# Patient Record
Sex: Male | Born: 1955 | Race: White | Hispanic: No | Marital: Married | Smoking: Current every day smoker
Health system: Southern US, Community
[De-identification: ages and names within clinical notes are randomized; demographics above are authoritative.]

## PROBLEM LIST (undated history)

## (undated) DIAGNOSIS — E785 Hyperlipidemia, unspecified: Secondary | ICD-10-CM

## (undated) DIAGNOSIS — M109 Gout, unspecified: Secondary | ICD-10-CM

## (undated) HISTORY — DX: Hyperlipidemia, unspecified: E78.5

---

## 2009-12-27 ENCOUNTER — Emergency Department: Payer: Self-pay | Admitting: Internal Medicine

## 2010-01-02 ENCOUNTER — Emergency Department: Payer: Self-pay | Admitting: Emergency Medicine

## 2010-01-17 ENCOUNTER — Emergency Department: Payer: Self-pay | Admitting: Emergency Medicine

## 2014-11-30 ENCOUNTER — Ambulatory Visit: Payer: Self-pay

## 2020-09-12 ENCOUNTER — Telehealth: Payer: Self-pay

## 2020-09-12 DIAGNOSIS — Z122 Encounter for screening for malignant neoplasm of respiratory organs: Secondary | ICD-10-CM

## 2020-09-12 DIAGNOSIS — Z87891 Personal history of nicotine dependence: Secondary | ICD-10-CM

## 2020-09-12 NOTE — Telephone Encounter (Signed)
Contacted patient for lung CT screening program based on referral form Lee Carter.  Patient is agreeable and CT scheduled for Dec 28 at 2pm.  He confirmed he has [employer].  He is a current smoker who started smoking at age 64.  He smokes less than a pack a day.  He will be texted address to imaging center, phone number to lung navigator and appt day and time.

## 2020-09-13 NOTE — Telephone Encounter (Signed)
Current smoker, 30 pack year history

## 2020-09-13 NOTE — Addendum Note (Signed)
Addended by: Jonne Ply on: 09/13/2020 09:49 AM   Modules accepted: Orders

## 2020-09-27 ENCOUNTER — Other Ambulatory Visit: Payer: Self-pay

## 2020-09-27 DIAGNOSIS — Z20822 Contact with and (suspected) exposure to covid-19: Secondary | ICD-10-CM

## 2020-09-29 ENCOUNTER — Ambulatory Visit: Payer: Self-pay

## 2020-09-29 LAB — SPECIMEN STATUS REPORT

## 2020-09-29 LAB — SARS-COV-2, NAA 2 DAY TAT

## 2020-09-29 LAB — NOVEL CORONAVIRUS, NAA: SARS-CoV-2, NAA: NOT DETECTED

## 2020-10-17 ENCOUNTER — Encounter: Payer: Self-pay | Admitting: Nurse Practitioner

## 2020-10-18 ENCOUNTER — Inpatient Hospital Stay: Payer: BC Managed Care – PPO | Attending: Nurse Practitioner | Admitting: Nurse Practitioner

## 2020-10-18 ENCOUNTER — Ambulatory Visit
Admission: RE | Admit: 2020-10-18 | Discharge: 2020-10-18 | Disposition: A | Discharge status: Home / Self Care | Payer: BC Managed Care – PPO | Source: Ambulatory Visit | Attending: Nurse Practitioner | Admitting: Nurse Practitioner

## 2020-10-18 ENCOUNTER — Emergency Department: Payer: BC Managed Care – PPO

## 2020-10-18 ENCOUNTER — Emergency Department
Admission: EM | Admit: 2020-10-18 | Discharge: 2020-10-18 | Disposition: A | Discharge status: Home / Self Care | Payer: BC Managed Care – PPO | Attending: Emergency Medicine | Admitting: Emergency Medicine

## 2020-10-18 ENCOUNTER — Other Ambulatory Visit: Payer: Self-pay

## 2020-10-18 DIAGNOSIS — F17210 Nicotine dependence, cigarettes, uncomplicated: Secondary | ICD-10-CM | POA: Insufficient documentation

## 2020-10-18 DIAGNOSIS — Z87891 Personal history of nicotine dependence: Secondary | ICD-10-CM | POA: Insufficient documentation

## 2020-10-18 DIAGNOSIS — Z122 Encounter for screening for malignant neoplasm of respiratory organs: Secondary | ICD-10-CM

## 2020-10-18 DIAGNOSIS — M109 Gout, unspecified: Secondary | ICD-10-CM | POA: Insufficient documentation

## 2020-10-18 DIAGNOSIS — M79671 Pain in right foot: Secondary | ICD-10-CM | POA: Diagnosis present

## 2020-10-18 LAB — BASIC METABOLIC PANEL
Anion gap: 10 (ref 5–15)
BUN: 14 mg/dL (ref 8–23)
CO2: 26 mmol/L (ref 22–32)
Calcium: 9.4 mg/dL (ref 8.9–10.3)
Chloride: 100 mmol/L (ref 98–111)
Creatinine, Ser: 1.21 mg/dL (ref 0.61–1.24)
GFR, Estimated: >60 mL/min (ref >60)
Glucose, Bld: 105 mg/dL — ABNORMAL HIGH (ref 70–99)
Potassium: 3.9 mmol/L (ref 3.5–5.1)
Sodium: 136 mmol/L (ref 135–145)

## 2020-10-18 LAB — CBC
HCT: 45.2 % (ref 39.0–52.0)
Hemoglobin: 15.6 g/dL (ref 13.0–17.0)
MCH: 31.7 pg (ref 26.0–34.0)
MCHC: 34.5 g/dL (ref 30.0–36.0)
MCV: 91.9 fL (ref 80.0–100.0)
Platelets: 232 10*3/uL (ref 150–400)
RBC: 4.92 MIL/uL (ref 4.22–5.81)
RDW: 12.7 % (ref 11.5–15.5)
WBC: 11.7 10*3/uL — ABNORMAL HIGH (ref 4.0–10.5)
nRBC: 0 % (ref 0.0–0.2)

## 2020-10-18 LAB — LACTIC ACID, PLASMA: Lactic Acid, Venous: 1.1 mmol/L (ref 0.5–1.9)

## 2020-10-18 LAB — URIC ACID: Uric Acid, Serum: 10.4 mg/dL — ABNORMAL HIGH (ref 3.7–8.6)

## 2020-10-18 MED ORDER — OXYCODONE-ACETAMINOPHEN 5-325 MG PO TABS: 1.0000 | ORAL_TABLET | ORAL | 0 refills | Status: AC | PRN

## 2020-10-18 MED ORDER — OXYCODONE-ACETAMINOPHEN 5-325 MG PO TABS
1.0000 | ORAL_TABLET | Freq: Once | ORAL | Status: AC
  Administered 2020-10-18: 19:00:00 1 via ORAL
  Filled 2020-10-18: qty 1

## 2020-10-18 NOTE — ED Triage Notes (Signed)
Pt comes with right foot swelling starting Saturday. Has had gout in toe before. No injury, bite, etc.

## 2020-10-18 NOTE — ED Provider Notes (Signed)
[facility] Emergency Department Provider Note   ____________________________________________   Event Date/Time   First MD Initiated Contact with Patient 10/18/20 1815     (approximate)  I have reviewed the triage vital signs and the nursing notes.   HISTORY  Chief Complaint Foot Pain    HPI Lee Carter is a 64 y.o. male with possible history of gout who presents to the ED complaining of foot pain.  Patient reports that for the past 3 days he has had increasing pain and swelling in his right foot.  He denies any recent injuries or trauma to the foot or ankle.  He has redness and swelling extending over the dorsum of his right foot and has significant pain with any movement at his right ankle.  The pain has gotten to the point that he cannot bear any weight on the foot.  He denies any fevers, nausea, vomiting, chest pain, or shortness of breath.  Symptoms are similar to when he had gout in the past, but previously it affected his right big toe.  He saw his PCP for this problem and was started on antibiotics a couple of days ago but has not had any relief.  He did take 1.2 mg of colchicine earlier today.        History reviewed. No pertinent past medical history.  There are no problems to display for this patient.   History reviewed. No pertinent surgical history.  Prior to Admission medications   Medication Sig Start Date End Date Taking? Authorizing Provider  oxyCODONE-acetaminophen (PERCOCET) 5-325 MG tablet Take 1 tablet by mouth every 4 (four) hours as needed. 10/18/20 10/18/21 Yes Chesley Noon, MD    Allergies Patient has no known allergies.  History reviewed. No pertinent family history.  Social History Social History   Tobacco Use  . Smoking status: Current Every Day Smoker    Packs/day: 0.75    Years: 40.00    Pack years: 30.00    Types: Cigarettes    Review of Systems  Constitutional: No fever/chills Eyes: No visual  changes. ENT: No sore throat. Cardiovascular: Denies chest pain. Respiratory: Denies shortness of breath. Gastrointestinal: No abdominal pain.  No nausea, no vomiting.  No diarrhea.  No constipation. Genitourinary: Negative for dysuria. Musculoskeletal: Negative for back pain.  Positive for right foot pain. Skin: Negative for rash. Neurological: Negative for headaches, focal weakness or numbness.  ____________________________________________   PHYSICAL EXAM:  VITAL SIGNS: ED Triage Vitals [10/18/20 1454]  Enc Vitals Group     BP (!) 149/92     Pulse Rate 89     Resp 18     Temp 99.2 F (37.3 C)     Temp Source Oral     SpO2 96 %     Weight 229 lb 4.5 oz (104 kg)     Height 5\' 6"  (1.676 m)     Head Circumference      Peak Flow      Pain Score 10     Pain Loc      Pain Edu?      Excl. in GC?     Constitutional: Alert and oriented. Eyes: Conjunctivae are normal. Head: Atraumatic. Nose: No congestion/rhinnorhea. Mouth/Throat: Mucous membranes are moist. Neck: Normal ROM Cardiovascular: Normal rate, regular rhythm. Grossly normal heart sounds. Respiratory: Normal respiratory effort.  No retractions. Lungs CTAB. Gastrointestinal: Soft and nontender. No distention. Genitourinary: deferred Musculoskeletal: Diffuse edema and mild erythema over the dorsum of right foot with  associated tenderness to palpation.  Mild discomfort noted with range of motion of right big toe, more significant discomfort with ranging of right ankle. Neurologic:  Normal speech and language. No gross focal neurologic deficits are appreciated. Skin:  Skin is warm, dry and intact. No rash noted. Psychiatric: Mood and affect are normal. Speech and behavior are normal.  ____________________________________________   LABS (all labs ordered are listed, but only abnormal results are displayed)  Labs Reviewed  BASIC METABOLIC PANEL - Abnormal; Notable for the following components:      Result Value    Glucose, Bld 105 (*)    All other components within normal limits  CBC - Abnormal; Notable for the following components:   WBC 11.7 (*)    All other components within normal limits  URIC ACID - Abnormal; Notable for the following components:   Uric Acid, Serum 10.4 (*)    All other components within normal limits  LACTIC ACID, PLASMA  LACTIC ACID, PLASMA    PROCEDURES  Procedure(s) performed (including Critical Care):  Procedures   ____________________________________________   INITIAL IMPRESSION / ASSESSMENT AND PLAN / ED COURSE       64 year old male with past medical history of gout presents to the ED with 3 days of pain and swelling in his right foot, most severe with movement of his right ankle.  He does have erythema and tenderness diffusely to the foot but pain is greatest with any range of motion of his right ankle.  He has no fevers and symptoms seem most consistent with gouty arthritis rather than a septic arthritis.  Uric acid is elevated and there is only a very mild leukocytosis.  He is currently being treated with antibiotics by his PCP, which I would not change.  He took loading dose of colchicine earlier today and was counseled to take 2 more doses of 0.6 mg for further treatment.  We will also start patient on short course of pain medication.  He was counseled to follow-up with his PCP and also provided with referral to rheumatology.  He was counseled to return to the ED for new worsening symptoms, patient agrees with plan.      ____________________________________________   FINAL CLINICAL IMPRESSION(S) / ED DIAGNOSES  Final diagnoses:  Acute gout of right ankle, unspecified cause     ED Discharge Orders         Ordered    oxyCODONE-acetaminophen (PERCOCET) 5-325 MG tablet  Every 4 hours PRN        10/18/20 1917           Note:  This document was prepared using Dragon voice recognition software and may include unintentional dictation errors.    Chesley Noon, MD 10/18/20 1921

## 2020-10-18 NOTE — Progress Notes (Signed)
Virtual Visit via Video Enabled Telemedicine Note   I connected with Sharee Holster on 10/18/20 at 1:45 PM EST by video enabled telemedicine visit and verified that I am speaking with the correct person using two identifiers.   I discussed the limitations, risks, security and privacy concerns of performing an evaluation and management service by telemedicine and the availability of in-person appointments. I also discussed with the patient that there may be a patient responsible charge related to this service. The patient expressed understanding and agreed to proceed.   Other persons participating in the visit and their role in the encounter: Burgess Estelle, RN- checking in patient & navigation  Patient's location: [place]  Provider's location: Clinic  Chief Complaint: Low Dose CT Screening  Patient agreed to evaluation by telemedicine to discuss shared decision making for consideration of low dose CT lung cancer screening.    In accordance with CMS guidelines, patient has met eligibility criteria including age, absence of signs or symptoms of lung cancer.  Social History   Tobacco Use  . Smoking status: Current Every Day Smoker    Packs/day: 0.75    Years: 40.00    Pack years: 30.00    Types: Cigarettes  . Smokeless tobacco: Not on file  Substance Use Topics  . Alcohol use: Not on file     A shared decision-making session was conducted prior to the performance of CT scan. This includes one or more decision aids, includes benefits and harms of screening, follow-up diagnostic testing, over-diagnosis, false positive rate, and total radiation exposure.   Counseling on the importance of adherence to annual lung cancer LDCT screening, impact of co-morbidities, and ability or willingness to undergo diagnosis and treatment is imperative for compliance of the program.   Counseling on the importance of continued smoking cessation for former smokers; the importance of smoking cessation  for current smokers, and information about tobacco cessation interventions have been given to patient including [place] and [facility] [employer] [place] programs.   Written order for lung cancer screening with LDCT has been given to the patient and any and all questions have been answered to the best of my abilities.    Yearly follow up will be coordinated by Burgess Estelle, Thoracic Navigator.  I discussed the assessment and treatment plan with the patient. The patient was provided an opportunity to ask questions and all were answered. The patient agreed with the plan and demonstrated an understanding of the instructions.   The patient was advised to call back or seek an in-person evaluation if the symptoms worsen or if the condition fails to improve as anticipated.   I provided 15 minutes of face-to-face video visit time during this encounter, and > 50% was spent counseling as documented under my assessment & plan.   Beckey Rutter, DNP, AGNP-C [place] at [facility] [phone] (clinic)

## 2020-10-20 ENCOUNTER — Encounter: Payer: Self-pay | Admitting: *Deleted

## 2021-06-30 IMAGING — CR DG FOOT 2V*R*
2 series · 2 of 2 positions shown · non-contrast
Comparison: None.

CLINICAL DATA: Right foot swelling for 3 days. Infection versus
gout. Patient reports history of gout. No known injury.

EXAM:
RIGHT FOOT - 2 VIEW

[foot ap]
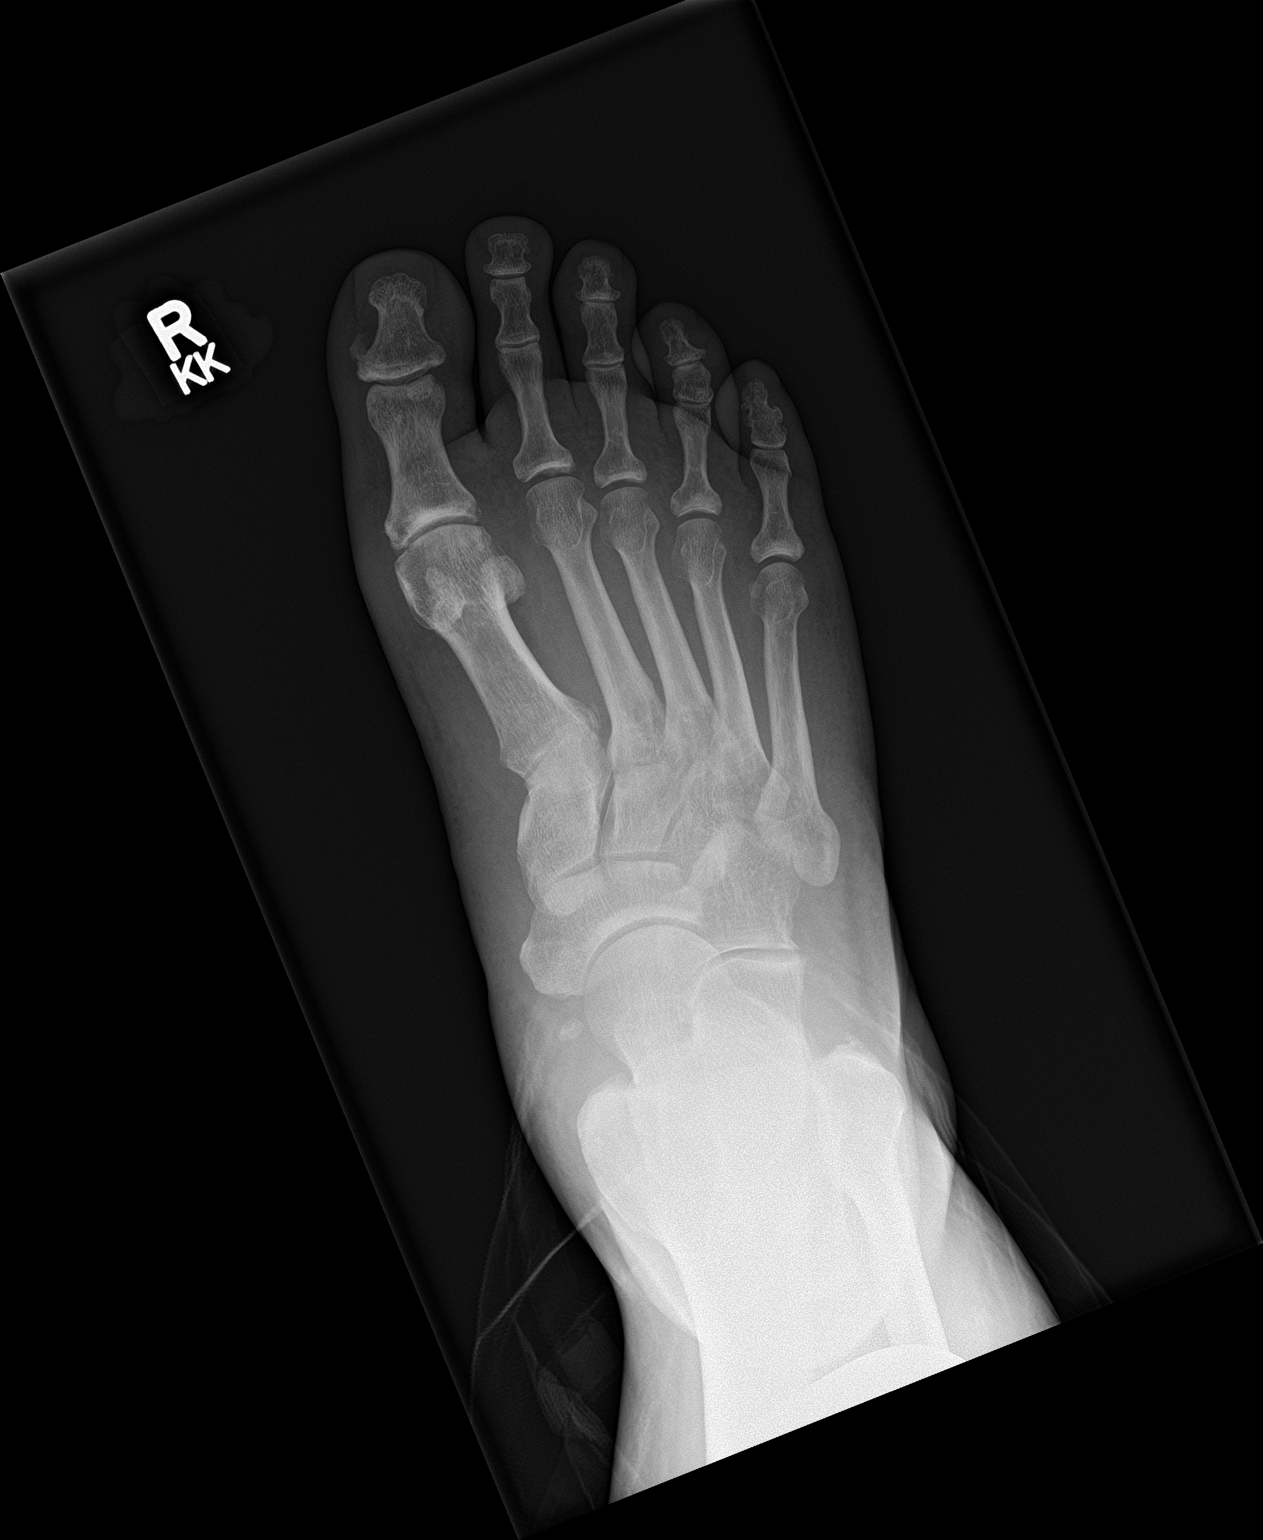

[foot lat]
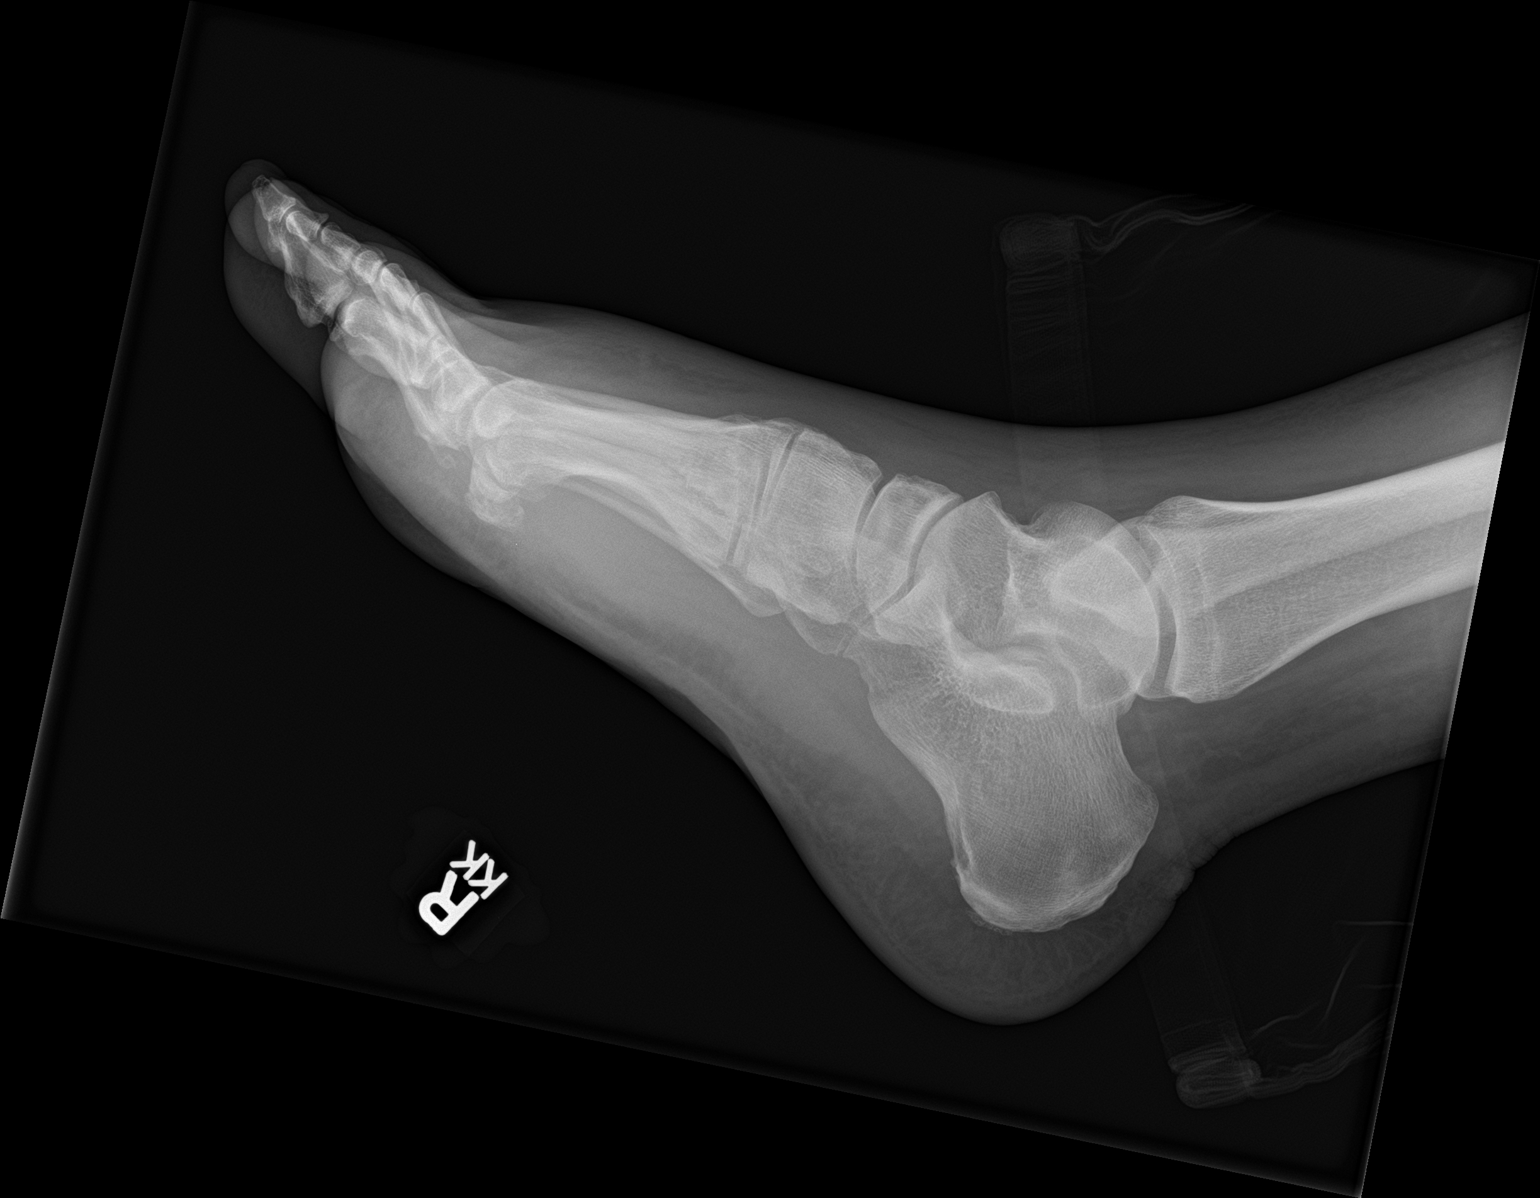

[2 of 2 positions shown; findings below may reference images not displayed]

FINDINGS: There is no evidence of fracture or dislocation. Mild osteoarthritis
of the first metatarsal phalangeal joint. No erosion or bony
destruction. Type 2 os navicular. Soft tissue edema overlies the
dorsum of the foot. Slight soft tissue thickening about the medial
first metatarsal phalangeal joint. No soft tissue air or radiopaque
foreign body.
IMPRESSION: 1. Dorsal soft tissue edema. Mild soft tissue thickening adjacent to
the first metatarsal phalangeal joint, can be seen in the setting of
gout. No soft tissue calcifications or soft tissue air.
2. Mild osteoarthritis of the first metatarsophalangeal joint. No
erosive change or acute osseous abnormality.

## 2021-06-30 IMAGING — CT CT CHEST LUNG CANCER SCREENING LOW DOSE W/O CM
2 of 5 series · 15 of 40 positions shown, 18 images · non-contrast
Comparison: None.

CLINICAL DATA: 64-year-old male current smoker, with 30 pack-year
history of smoking, for initial lung cancer screening

EXAM:
CT CHEST WITHOUT CONTRAST LOW-DOSE FOR LUNG CANCER SCREENING
TECHNIQUE: Multidetector CT imaging of the chest was performed following the
standard protocol without IV contrast.

[Series 3: lung 1.00 · axial · 0.81mm/px · z∈[-1206,-899]mm · 12 of 339 slices shown, 15 images]
[im 16/339  mediastinal]
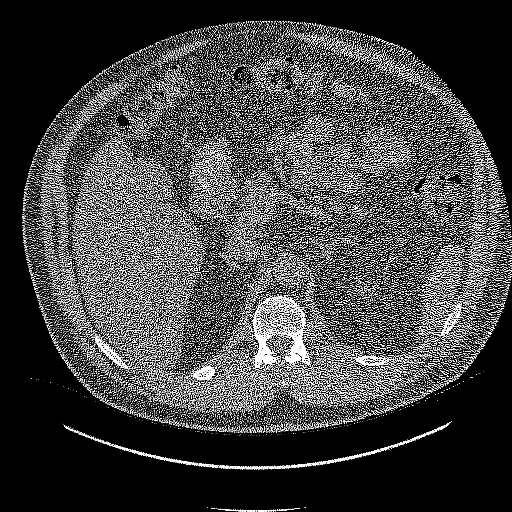
[im 16/339  lung]
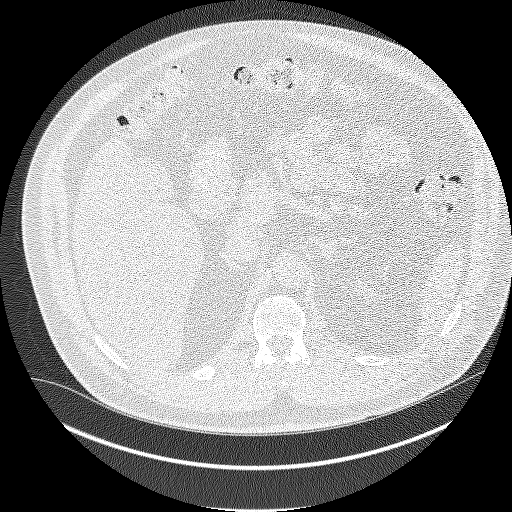
[im 47/339  lung]
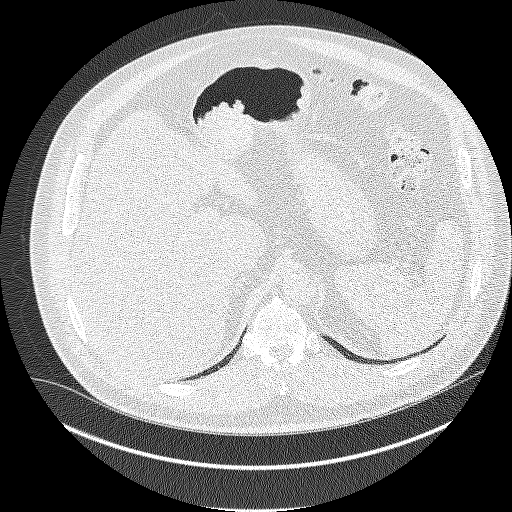
[im 77/339  lung]
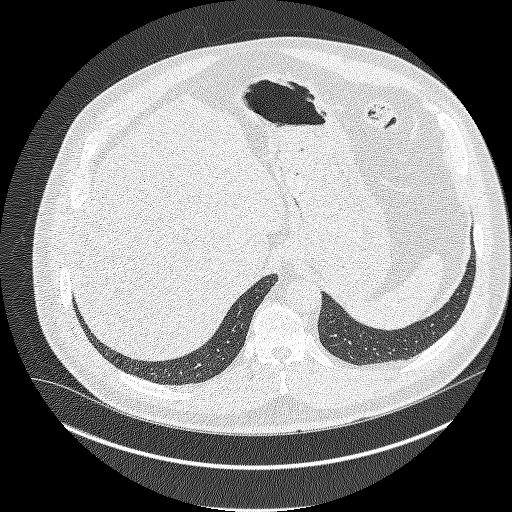
[im 108/339  lung]
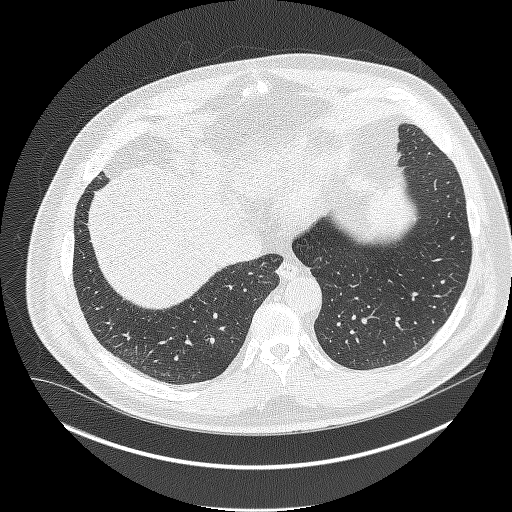
[im 123/339  mediastinal]
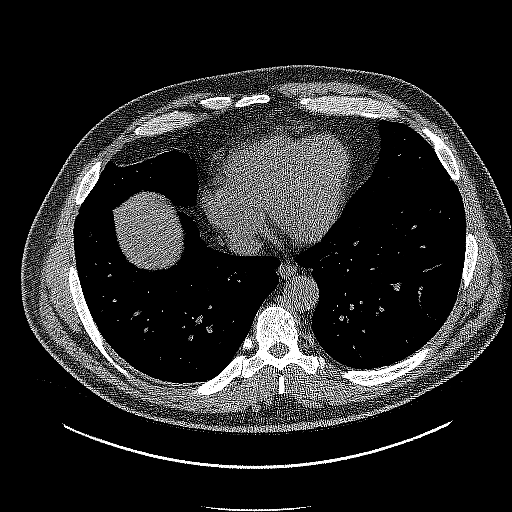
[im 123/339  lung]
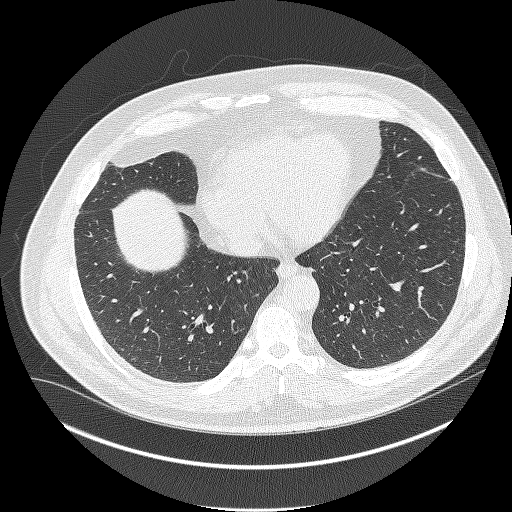
[im 154/339  lung]
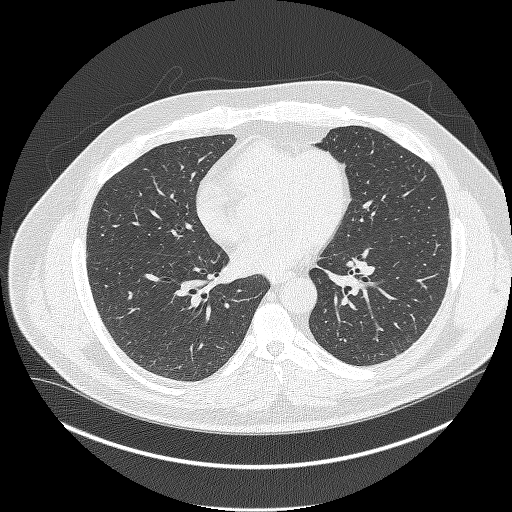
[im 185/339  lung]
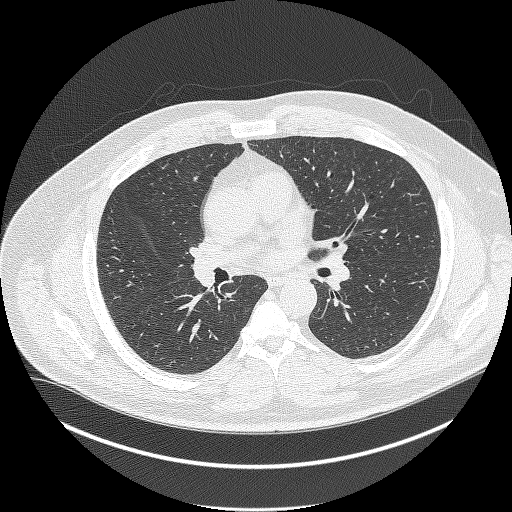
[im 216/339  lung]
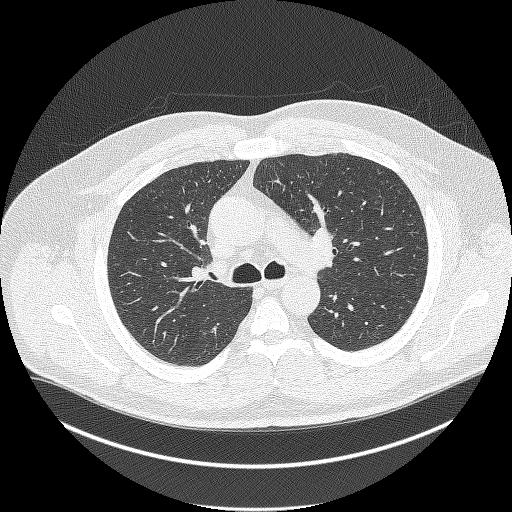
[im 231/339  mediastinal]
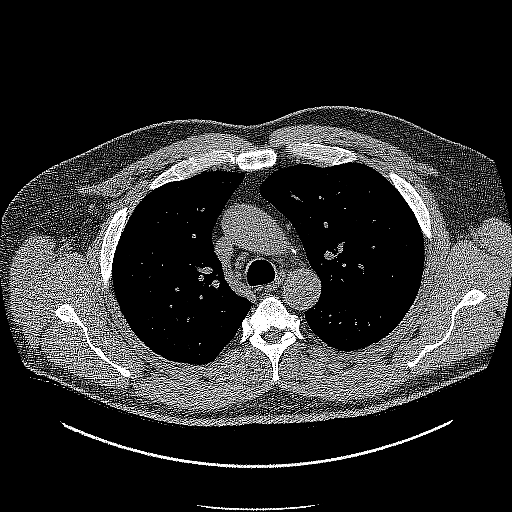
[im 231/339  lung]
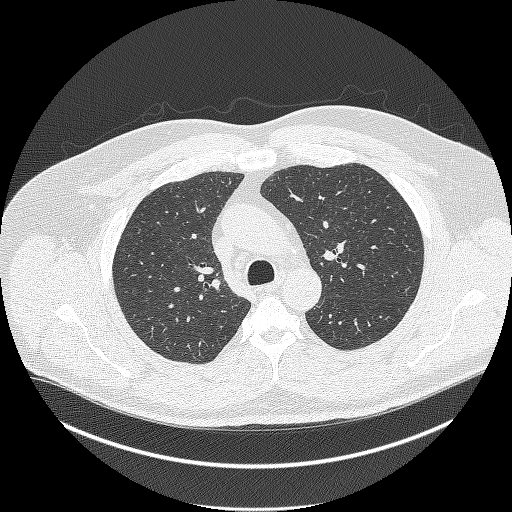
[im 262/339  lung]
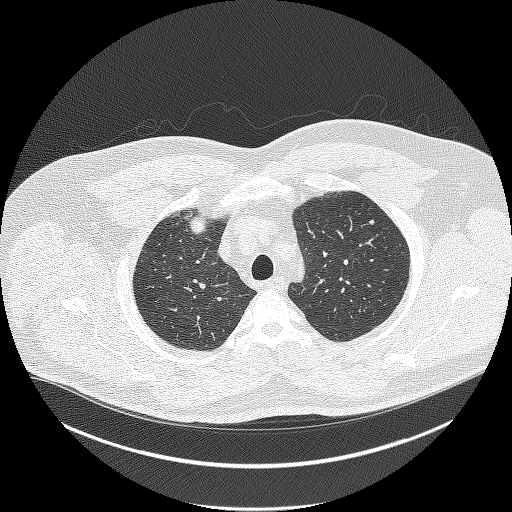
[im 292/339  lung]
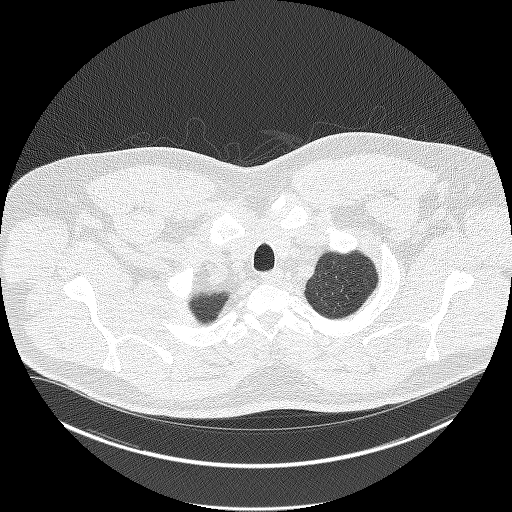
[im 323/339  lung]
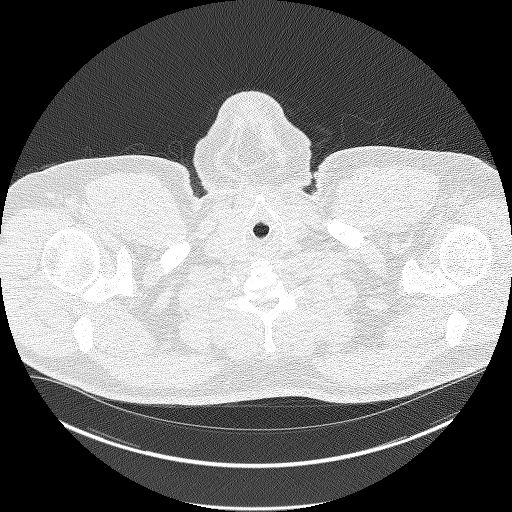

[Series 4: coronals lung 1.00 cor · coronal · 0.66mm/px · 3 of 385 slices shown]
[im 77/385  lung]
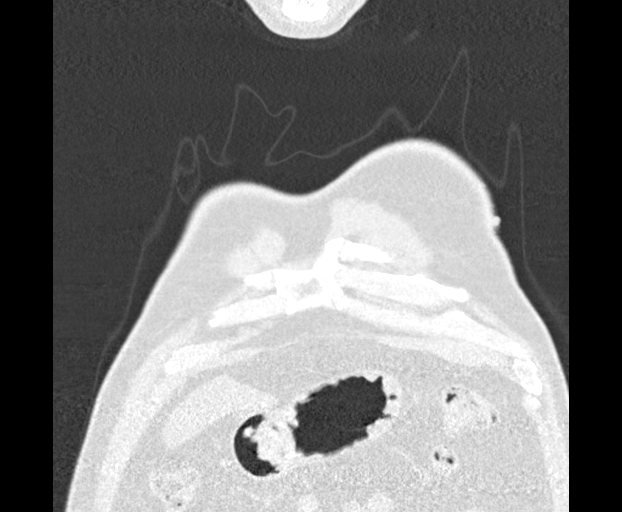
[im 154/385  lung]
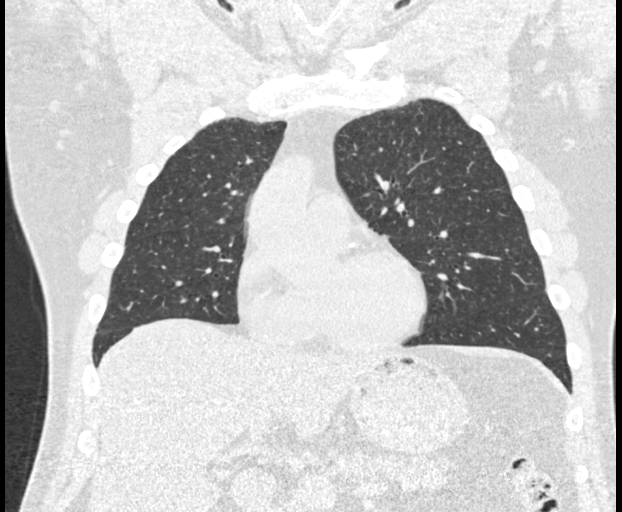
[im 231/385  lung]
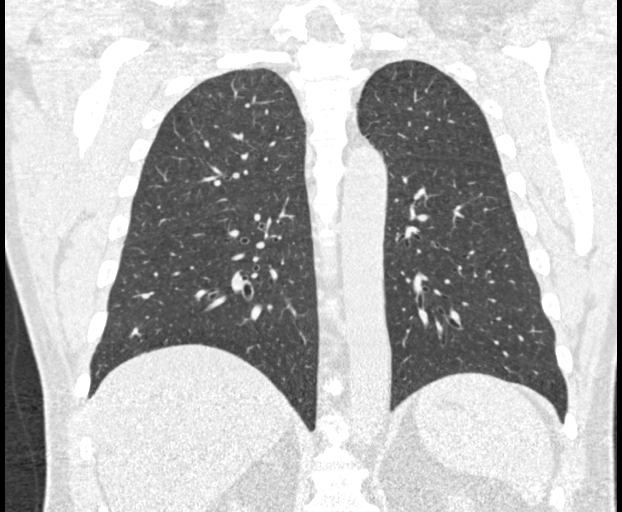

[15 of 40 positions shown; findings below may reference images not displayed]

FINDINGS: Cardiovascular: Heart is normal in size.  No pericardial effusion.

No evidence of thoracic aortic aneurysm. Mild atherosclerotic
calcifications of the aortic arch.

Mild coronary atherosclerosis the LAD and right coronary artery.

Mediastinum/Nodes: No suspicious mediastinal lymphadenopathy.

Visualized thyroid is unremarkable.

Lungs/Pleura: Mild centrilobular emphysematous changes, upper lung
predominant.

No focal consolidation.

Small bilateral pulmonary nodules, measuring up to 5.7 mm in the
right lower lobe.

No pleural effusion or pneumothorax.

Upper Abdomen: Visualized upper abdomen is notable for mild hepatic
steatosis.

Musculoskeletal: Visualized osseous structures are within normal
limits.
IMPRESSION: Lung-RADS 2, benign appearance or behavior. Continue annual
screening with low-dose chest CT without contrast in 12 months.

Aortic Atherosclerosis (PICC3-BN9.9) and Emphysema (PICC3-KT8.0).

## 2022-01-12 ENCOUNTER — Telehealth: Payer: Self-pay

## 2022-01-12 DIAGNOSIS — E785 Hyperlipidemia, unspecified: Secondary | ICD-10-CM | POA: Insufficient documentation

## 2022-01-12 DIAGNOSIS — M109 Gout, unspecified: Secondary | ICD-10-CM | POA: Insufficient documentation

## 2022-01-12 NOTE — Telephone Encounter (Signed)
Left voicemail with call back number for patient to call to schedule annual LDCT  

## 2022-06-14 ENCOUNTER — Ambulatory Visit
Admission: EM | Admit: 2022-06-14 | Discharge: 2022-06-14 | Disposition: A | Discharge status: Home / Self Care | Payer: BC Managed Care – PPO

## 2022-06-14 DIAGNOSIS — M109 Gout, unspecified: Secondary | ICD-10-CM

## 2022-06-14 HISTORY — DX: Gout, unspecified: M10.9

## 2022-06-14 MED ORDER — COLCHICINE 0.6 MG PO TABS: ORAL_TABLET | ORAL | 0 refills | Status: DC

## 2022-06-14 MED ORDER — ALLOPURINOL 100 MG PO TABS: 100.0000 mg | ORAL_TABLET | Freq: Every day | ORAL | 1 refills | Status: DC

## 2022-06-14 NOTE — ED Provider Notes (Signed)
[name]    CSN: [id number] Arrival date & time: 06/14/22  1029      History   Chief Complaint Chief Complaint  Patient presents with   Foot Pain    HPI Lee Carter is a 66 y.o. male.   Patient presents with constant right great toe pain for 7 days.  Associated swelling and erythema.  Painful to bear weight and pain elicited with range of motion.  Has been using cane but is independent at baseline.  Has attempted use of Tylenol which has been minimally effective.  Denies injury or trauma, numbness or tingling.  Endorses a history of gout, was taking allopurinol daily but ran out of medication and endorses that his PCP has retired.  Past Medical History:  Diagnosis Date   Gout attack     There are no problems to display for this patient.   History reviewed. No pertinent surgical history.     Home Medications    Prior to Admission medications   Medication Sig Start Date End Date Taking? Authorizing Provider  allopurinol (ZYLOPRIM) 100 MG tablet Take 1 tablet (100 mg total) by mouth daily. 06/14/22   Valinda Hoar, NP  colchicine 0.6 MG tablet 1.2 mg ( 2 tablets) by mouth as a single dose at the first sign of gout flare, followed by 0.6 mg ( 1 tablet)  by mouth  as a single dose 1 hour late, then 0.6 mg ( 1 tablet ) by mouth daily until resolved 06/14/22   Valinda Hoar, NP    Family History History reviewed. No pertinent family history.  Social History Social History   Tobacco Use   Smoking status: Every Day    Packs/day: 0.75    Years: 40.00    Total pack years: 30.00    Types: Cigarettes  Substance Use Topics   Alcohol use: Not Currently     Allergies   Patient has no known allergies.   Review of Systems Review of Systems Defer to HPI   Physical Exam Triage Vital Signs ED Triage Vitals [06/14/22 1038]  Enc Vitals Group     BP (!) 175/98     Pulse Rate 70     Resp 18     Temp 97.8 F (36.6 C)     Temp Source Oral      SpO2 98 %     Weight      Height      Head Circumference      Peak Flow      Pain Score      Pain Loc      Pain Edu?      Excl. in GC?    No data found.  Updated Vital Signs BP (!) 175/98 (BP Location: Left Arm)   Pulse 70   Temp 97.8 F (36.6 C) (Oral)   Resp 18   SpO2 98%   Visual Acuity Right Eye Distance:   Left Eye Distance:   Bilateral Distance:    Right Eye Near:   Left Eye Near:    Bilateral Near:     Physical Exam Constitutional:      Appearance: Normal appearance.  HENT:     Head: Normocephalic.  Eyes:     Extraocular Movements: Extraocular movements intact.  Pulmonary:     Effort: Pulmonary effort is normal.  Feet:     Comments: Erythema, mild to moderate swelling and tenderness noted to the proximal phalanx joint of the right great toe, range  of motion intact, sensation intact, capillary refill less than 3, 2+ pedal pulse, able to bear weight but pain elicited Neurological:     Mental Status: He is alert and oriented to person, place, and time. Mental status is at baseline.  Psychiatric:        Mood and Affect: Mood normal.        Behavior: Behavior normal.      UC Treatments / Results  Labs (all labs ordered are listed, but only abnormal results are displayed) Labs Reviewed - No data to display  EKG   Radiology No results found.  Procedures Procedures (including critical care time)  Medications Ordered in UC Medications - No data to display  Initial Impression / Assessment and Plan / UC Course  I have reviewed the triage vital signs and the nursing notes.  Pertinent labs & imaging results that were available during my care of the patient were reviewed by me and considered in my medical decision making (see chart for details).  Gouty arthritis of right great toe  Presentation is consistent with gout and as no injury has occurred will defer imaging as low suspicion for bone involvement, patient endorses great success with colchicine  in the past and therefore he has been prescribed today for treatment of flare, represcribed allopurinol for daily use,, recommended additional supportive measures of ice and heat, elevation and dietary restrictions, internal referral for PCP placed as well as showed patient how to use online website, may follow-up with urgent care as needed if symptoms persist or worsen Final Clinical Impressions(s) / UC Diagnoses   Final diagnoses:  Gouty arthritis of right great toe     Discharge Instructions      Today you are being treated for a flare of your gout  Take colchicine as directed on packaging to help reduce flare  Once flare has resolved begin allopurinol daily for prophylactic medication  May use ice or heat over the affected area in 10 to 15-minute intervals  May elevate extremity when sitting and lying to help reduce swelling  Avoid buying products that contain high-fructose corn syrup. Check for this on food labels. It is commonly found in many processed foods and soft drinks. Be sure to check for it in baked goods such as cookies, canned fruits, and cereals and cereal bars. Avoid buying veal, chicken breast with skin, lamb, and organ meats such as liver. These types of meats tend to have the highest purine content. Choose dairy products. These may lower uric acid levels. Avoid certain types of fish. Not all fish and seafood have high purine content. Examples with high purine content include anchovies, trout, tuna, sardines, and salmon. Avoid buying beverages that contain alcohol, particularly beer and hard liquor. Alcohol can affect the way your body gets rid of uric acid.  If symptoms continue to persist or worsen past use of medicine you may follow-up with urgent care or your primary doctor for reevaluation   The internal referral has been placed for you with your primary doctor to help you find a new office, someone will check on you in the next few weeks to assist with this  process   ED Prescriptions     Medication Sig Dispense Auth. Provider   allopurinol (ZYLOPRIM) 100 MG tablet Take 1 tablet (100 mg total) by mouth daily. 30 tablet Dearion Huot R, NP   colchicine 0.6 MG tablet  (Status: Discontinued) 1.2 mg ( 2 tablets) by mouth as a single dose at the  first sign of gout flare, followed by 0.6 mg ( 1 tablet)  by mouth  as a single dose 1 hour late, then 0.6 mg ( 1 tablet ) by mouth daily until resolved 10 tablet Kyndal Heringer R, NP   colchicine 0.6 MG tablet 1.2 mg ( 2 tablets) by mouth as a single dose at the first sign of gout flare, followed by 0.6 mg ( 1 tablet)  by mouth  as a single dose 1 hour late, then 0.6 mg ( 1 tablet ) by mouth daily until resolved 10 tablet Aradhya Shellenbarger, Elita Boone, NP      PDMP not reviewed this encounter.   Valinda Hoar, NP 06/14/22 1110

## 2022-06-14 NOTE — Discharge Instructions (Addendum)
Today you are being treated for a flare of your gout  Take colchicine as directed on packaging to help reduce flare  Once flare has resolved begin allopurinol daily for prophylactic medication  May use ice or heat over the affected area in 10 to 15-minute intervals  May elevate extremity when sitting and lying to help reduce swelling  Avoid buying products that contain high-fructose corn syrup. Check for this on food labels. It is commonly found in many processed foods and soft drinks. Be sure to check for it in baked goods such as cookies, canned fruits, and cereals and cereal bars. Avoid buying veal, chicken breast with skin, lamb, and organ meats such as liver. These types of meats tend to have the highest purine content. Choose dairy products. These may lower uric acid levels. Avoid certain types of fish. Not all fish and seafood have high purine content. Examples with high purine content include anchovies, trout, tuna, sardines, and salmon. Avoid buying beverages that contain alcohol, particularly beer and hard liquor. Alcohol can affect the way your body gets rid of uric acid.  If symptoms continue to persist or worsen past use of medicine you may follow-up with urgent care or your primary doctor for reevaluation   The internal referral has been placed for you with your primary doctor to help you find a new office, someone will check on you in the next few weeks to assist with this process

## 2022-06-14 NOTE — ED Triage Notes (Signed)
Pt presents with right foot pain for past few days, has h/o gout

## 2022-07-02 ENCOUNTER — Ambulatory Visit: Payer: BC Managed Care – PPO | Admitting: Family Medicine

## 2022-07-02 VITALS — BP 147/95 | HR 90 | Temp 97.5°F | Ht 66.0 in | Wt 224.0 lb

## 2022-07-02 DIAGNOSIS — E669 Obesity, unspecified: Secondary | ICD-10-CM | POA: Diagnosis not present

## 2022-07-02 DIAGNOSIS — R030 Elevated blood-pressure reading, without diagnosis of hypertension: Secondary | ICD-10-CM

## 2022-07-02 DIAGNOSIS — Z125 Encounter for screening for malignant neoplasm of prostate: Secondary | ICD-10-CM

## 2022-07-02 DIAGNOSIS — Z720 Tobacco use: Secondary | ICD-10-CM

## 2022-07-02 DIAGNOSIS — E785 Hyperlipidemia, unspecified: Secondary | ICD-10-CM | POA: Diagnosis not present

## 2022-07-02 DIAGNOSIS — Z130 Encounter for screening for diseases of the blood and blood-forming organs and certain disorders involving the immune mechanism: Secondary | ICD-10-CM

## 2022-07-02 DIAGNOSIS — M109 Gout, unspecified: Secondary | ICD-10-CM | POA: Diagnosis not present

## 2022-07-02 MED ORDER — PREDNISONE 10 MG PO TABS: ORAL_TABLET | ORAL | 0 refills | Status: DC

## 2022-07-02 MED ORDER — VARENICLINE TARTRATE 1 MG PO TABS: 1.0000 mg | ORAL_TABLET | Freq: Two times a day (BID) | ORAL | 0 refills | Status: DC

## 2022-07-02 MED ORDER — CHANTIX STARTING MONTH PAK 0.5 MG X 11 & 1 MG X 42 PO TBPK: ORAL_TABLET | ORAL | 0 refills | Status: DC

## 2022-07-02 NOTE — Patient Instructions (Signed)
Medications as prescribed.  Labs today.  Follow up in 3 months.  

## 2022-07-03 ENCOUNTER — Other Ambulatory Visit: Payer: Self-pay | Admitting: Family Medicine

## 2022-07-03 DIAGNOSIS — Z720 Tobacco use: Secondary | ICD-10-CM | POA: Insufficient documentation

## 2022-07-03 DIAGNOSIS — R030 Elevated blood-pressure reading, without diagnosis of hypertension: Secondary | ICD-10-CM | POA: Insufficient documentation

## 2022-07-03 LAB — CBC
Hematocrit: 49.1 % (ref 37.5–51.0)
Hemoglobin: 16.5 g/dL (ref 13.0–17.7)
MCH: 31.1 pg (ref 26.6–33.0)
MCHC: 33.6 g/dL (ref 31.5–35.7)
MCV: 93 fL (ref 79–97)
Platelets: 288 10*3/uL (ref 150–450)
RBC: 5.31 x10E6/uL (ref 4.14–5.80)
RDW: 12.9 % (ref 11.6–15.4)
WBC: 11.6 10*3/uL — ABNORMAL HIGH (ref 3.4–10.8)

## 2022-07-03 LAB — CMP14+EGFR
ALT: 17 IU/L (ref 0–44)
AST: 16 IU/L (ref 0–40)
Albumin/Globulin Ratio: 2 (ref 1.2–2.2)
Albumin: 4.9 g/dL (ref 3.9–4.9)
Alkaline Phosphatase: 109 IU/L (ref 44–121)
BUN/Creatinine Ratio: 10 (ref 10–24)
BUN: 13 mg/dL (ref 8–27)
Bilirubin Total: 0.5 mg/dL (ref 0.0–1.2)
CO2: 22 mmol/L (ref 20–29)
Calcium: 10.3 mg/dL — ABNORMAL HIGH (ref 8.6–10.2)
Chloride: 103 mmol/L (ref 96–106)
Creatinine, Ser: 1.29 mg/dL — ABNORMAL HIGH (ref 0.76–1.27)
Globulin, Total: 2.5 g/dL (ref 1.5–4.5)
Glucose: 113 mg/dL — ABNORMAL HIGH (ref 70–99)
Potassium: 4.2 mmol/L (ref 3.5–5.2)
Sodium: 143 mmol/L (ref 134–144)
Total Protein: 7.4 g/dL (ref 6.0–8.5)
eGFR: 62 mL/min/{1.73_m2} (ref >59)

## 2022-07-03 LAB — LIPID PANEL
Chol/HDL Ratio: 6.7 ratio — ABNORMAL HIGH (ref 0.0–5.0)
Cholesterol, Total: 182 mg/dL (ref 100–199)
HDL: 27 mg/dL — ABNORMAL LOW (ref >39)
LDL Chol Calc (NIH): 107 mg/dL — ABNORMAL HIGH (ref 0–99)
Triglycerides: 280 mg/dL — ABNORMAL HIGH (ref 0–149)
VLDL Cholesterol Cal: 48 mg/dL — ABNORMAL HIGH (ref 5–40)

## 2022-07-03 LAB — HEMOGLOBIN A1C
Est. average glucose Bld gHb Est-mCnc: 123 mg/dL
Hgb A1c MFr Bld: 5.9 % — ABNORMAL HIGH (ref 4.8–5.6)

## 2022-07-03 LAB — URIC ACID: Uric Acid: 9.6 mg/dL — ABNORMAL HIGH (ref 3.8–8.4)

## 2022-07-03 LAB — PSA: Prostate Specific Ag, Serum: 0.3 ng/mL (ref 0.0–4.0)

## 2022-07-03 MED ORDER — ALLOPURINOL 100 MG PO TABS: 100.0000 mg | ORAL_TABLET | Freq: Every day | ORAL | 1 refills | Status: DC

## 2022-07-03 MED ORDER — ATORVASTATIN CALCIUM 40 MG PO TABS: 40.0000 mg | ORAL_TABLET | Freq: Every day | ORAL | 3 refills | Status: DC

## 2022-07-03 MED ORDER — COLCHICINE 0.6 MG PO TABS: 0.6000 mg | ORAL_TABLET | Freq: Every day | ORAL | 1 refills | Status: DC

## 2022-07-03 NOTE — Assessment & Plan Note (Signed)
Lipids are uncontrolled.  Recommending lipid-lowering therapy.

## 2022-07-03 NOTE — Assessment & Plan Note (Signed)
BP elevated today.  Patient needs to check his blood pressures at home.  Will need close follow-up with me.

## 2022-07-03 NOTE — Progress Notes (Signed)
Subjective:  Patient ID: Lee Carter, male    DOB: 11-Apr-1956  Age: 66 y.o. MRN: [id number]  CC: Chief Complaint  Patient presents with   Establish Care    Foot pain problem  Smoking cessation    HPI:  66 year old male with tobacco abuse, gout, hyperlipidemia presents to establish care.  Patient reports ongoing bilateral foot pain.  Has been worsening as of late.  He has recently restarted back on allopurinol and states that this does not seem to help.  He has had a prior injury to the right foot for which she saw orthopedics.  I suspect that he has uncontrolled gout.  No recent uric acid available.  Patient currently smokes 1 pack/day.  He is interested in smoking cessation.  He would like to discuss this today.  Patient Active Problem List   Diagnosis Date Noted   Elevated BP without diagnosis of hypertension 07/03/2022   Tobacco abuse 07/03/2022   Gout 01/12/2022   HLD (hyperlipidemia) 01/12/2022    Social Hx   Social History   Socioeconomic History   Marital status: Married    Spouse name: Not on file   Number of children: Not on file   Years of education: Not on file   Highest education level: Not on file  Occupational History   Not on file  Tobacco Use   Smoking status: Every Day    Packs/day: 0.75    Years: 40.00    Total pack years: 30.00    Types: Cigarettes   Smokeless tobacco: Not on file  Substance and Sexual Activity   Alcohol use: Not Currently   Drug use: Not on file   Sexual activity: Not on file  Other Topics Concern   Not on file  Social History Narrative   Not on file   Social Determinants of Health   Financial Resource Strain: Not on file  Food Insecurity: Not on file  Transportation Needs: Not on file  Physical Activity: Not on file  Stress: Not on file  Social Connections: Not on file    Review of Systems Per HPI  Objective:  BP (!) 147/95   Pulse 90   Temp (!) 97.5 F (36.4 C)   Ht _0  (1.676 m)   Wt 224 lb (101.6  kg)   SpO2 97%   BMI 36.15 kg/m      07/02/2022    3:26 PM 07/02/2022    2:52 PM 06/14/2022   10:38 AM  BP/Weight  Systolic BP 016 553 748  Diastolic BP 95 96 98  Wt. (Lbs)  224   BMI  36.15 kg/m2     Physical Exam Constitutional:      Appearance: Normal appearance. He is obese.  HENT:     Head: Normocephalic and atraumatic.  Eyes:     General:        Right eye: No discharge.        Left eye: No discharge.     Conjunctiva/sclera: Conjunctivae normal.  Cardiovascular:     Rate and Rhythm: Normal rate and regular rhythm.  Pulmonary:     Effort: Pulmonary effort is normal.     Breath sounds: Normal breath sounds. No wheezing, rhonchi or rales.  Musculoskeletal:       Feet:  Feet:     Comments: Patient with tenderness over the labeled locations of both feet bilaterally.  Patient also has pain and swelling of the left great toe. Neurological:     Mental Status: He  is alert.  Psychiatric:        Mood and Affect: Mood normal.        Behavior: Behavior normal.     Lab Results  Component Value Date   WBC 11.6 (H) 07/02/2022   HGB 16.5 07/02/2022   HCT 49.1 07/02/2022   PLT 288 07/02/2022   GLUCOSE 113 (H) 07/02/2022   CHOL 182 07/02/2022   TRIG 280 (H) 07/02/2022   HDL 27 (L) 07/02/2022   LDLCALC 107 (H) 07/02/2022   ALT 17 07/02/2022   AST 16 07/02/2022   NA 143 07/02/2022   K 4.2 07/02/2022   CL 103 07/02/2022   CREATININE 1.29 (H) 07/02/2022   BUN 13 07/02/2022   CO2 22 07/02/2022   HGBA1C 5.9 (H) 07/02/2022     Assessment & Plan:   Problem List Items Addressed This Visit       Other   Elevated BP without diagnosis of hypertension    BP elevated today.  Patient needs to check his blood pressures at home.  Will need close follow-up with me.      Relevant Orders   CMP14+EGFR (Completed)   Gout - Primary    Patient's laboratory studies have returned and revealed markedly elevated uric acid.  I believe that the patient is experiencing an acute gout  flare on top of chronic gout.  Treating with prednisone.  Restart colchicine and allopurinol following prednisone taper.      Relevant Orders   Uric Acid (Completed)   HLD (hyperlipidemia)    Lipids are uncontrolled.  Recommending lipid-lowering therapy.      Relevant Orders   Lipid panel (Completed)   Tobacco abuse    Starting Chantix.      Other Visit Diagnoses     Prostate cancer screening       Relevant Orders   PSA (Completed)   Obesity (BMI 30-39.9)       Relevant Orders   CMP14+EGFR (Completed)   Hemoglobin A1c (Completed)   Screening for deficiency anemia       Relevant Orders   CBC (Completed)       Meds ordered this encounter  Medications   predniSONE (DELTASONE) 10 MG tablet    Sig: 50 mg daily x 3 days, then 40 mg daily x 3 days, then 30 mg daily x 3 days, then 20 mg daily x 3 days, then 10 mg daily x 3 days.    Dispense:  45 tablet    Refill:  0   Varenicline Tartrate, Starter, (CHANTIX STARTING MONTH PAK) 0.5 MG X 11 & 1 MG X 42 TBPK    Sig: Days 1 to 3: 0.5 mg once daily. Days 4 to 7: 0.5 mg twice daily. Maintenance (day 8 and later): 1 mg twice daily    Dispense:  52 each    Refill:  0   varenicline (CHANTIX CONTINUING MONTH PAK) 1 MG tablet    Sig: Take 1 tablet (1 mg total) by mouth 2 (two) times daily.    Dispense:  120 tablet    Refill:  0   allopurinol (ZYLOPRIM) 100 MG tablet    Sig: Take 1 tablet (100 mg total) by mouth daily.    Dispense:  90 tablet    Refill:  1   colchicine 0.6 MG tablet    Sig: Take 1 tablet (0.6 mg total) by mouth daily.    Dispense:  90 tablet    Refill:  1    Follow-up:  Return in about 3 months (around 10/01/2022).  [place]

## 2022-07-03 NOTE — Assessment & Plan Note (Signed)
Starting Chantix. 

## 2022-07-03 NOTE — Assessment & Plan Note (Signed)
Patient's laboratory studies have returned and revealed markedly elevated uric acid.  I believe that the patient is experiencing an acute gout flare on top of chronic gout.  Treating with prednisone.  Restart colchicine and allopurinol following prednisone taper.

## 2022-10-05 ENCOUNTER — Ambulatory Visit: Payer: BC Managed Care – PPO | Admitting: Family Medicine

## 2022-10-05 DIAGNOSIS — Z720 Tobacco use: Secondary | ICD-10-CM

## 2022-10-05 DIAGNOSIS — E785 Hyperlipidemia, unspecified: Secondary | ICD-10-CM | POA: Diagnosis not present

## 2022-10-05 DIAGNOSIS — M1A9XX0 Chronic gout, unspecified, without tophus (tophi): Secondary | ICD-10-CM

## 2022-10-05 DIAGNOSIS — R030 Elevated blood-pressure reading, without diagnosis of hypertension: Secondary | ICD-10-CM

## 2022-10-05 MED ORDER — VARENICLINE TARTRATE 1 MG PO TABS: 1.0000 mg | ORAL_TABLET | Freq: Two times a day (BID) | ORAL | 1 refills | Status: DC

## 2022-10-05 MED ORDER — VARENICLINE TARTRATE 0.5 MG PO TABS: ORAL_TABLET | ORAL | 0 refills | Status: DC

## 2022-10-05 NOTE — Assessment & Plan Note (Signed)
Sending in Chantix again.

## 2022-10-05 NOTE — Assessment & Plan Note (Signed)
Advised to monitor blood pressures at home.  We discussed diet briefly today.  Stay active.

## 2022-10-05 NOTE — Assessment & Plan Note (Signed)
No recent flares.  Continue allopurinol.  

## 2022-10-05 NOTE — Progress Notes (Signed)
Subjective:  Patient ID: Lee Carter, male    DOB: 1956/04/30  Age: 66 y.o. MRN: [id number]  CC: Chief Complaint  Patient presents with   Hyperlipidemia   sore feet    Has gained weight    HPI:  66 year old male with gout, hyperlipidemia, tobacco abuse presents for follow-up.  Patient has cut back on his smoking.  For some reason he was only given a short supply of his Chantix.  Will resend in today.  Patient's blood pressure is elevated today and was elevated the last time he was here.  Needs to monitor his blood pressure closely.  Needs weight loss.  No recent bouts of gout.  Doing well in this regard.  Lastly, patient is tolerating his statin therapy well.  Patient Active Problem List   Diagnosis Date Noted   Elevated BP without diagnosis of hypertension 07/03/2022   Tobacco abuse 07/03/2022   Gout 01/12/2022   HLD (hyperlipidemia) 01/12/2022    Social Hx   Social History   Socioeconomic History   Marital status: Married    Spouse name: Not on file   Number of children: Not on file   Years of education: Not on file   Highest education level: Not on file  Occupational History   Not on file  Tobacco Use   Smoking status: Every Day    Packs/day: 0.75    Years: 40.00    Total pack years: 30.00    Types: Cigarettes   Smokeless tobacco: Not on file  Substance and Sexual Activity   Alcohol use: Not Currently   Drug use: Not on file   Sexual activity: Not on file  Other Topics Concern   Not on file  Social History Narrative   Not on file   Social Determinants of Health   Financial Resource Strain: Not on file  Food Insecurity: Not on file  Transportation Needs: Not on file  Physical Activity: Not on file  Stress: Not on file  Social Connections: Not on file    Review of Systems Per HPI  Objective:  BP (!) 134/95   Pulse 80   Temp (!) 97.3 F (36.3 C)   Ht 5\' 6"  (1.676 m)   Wt 242 lb (109.8 kg)   SpO2 96%   BMI 39.06 kg/m       10/05/2022    1:08 PM 07/02/2022    3:26 PM 07/02/2022    2:52 PM  BP/Weight  Systolic BP 134 147 146  Diastolic BP 95 95 96  Wt. (Lbs) 242  224  BMI 39.06 kg/m2  36.15 kg/m2    Physical Exam Vitals and nursing note reviewed.  Constitutional:      General: He is not in acute distress.    Appearance: Normal appearance. He is obese.  HENT:     Head: Normocephalic and atraumatic.  Eyes:     General:        Right eye: No discharge.        Left eye: No discharge.     Conjunctiva/sclera: Conjunctivae normal.  Cardiovascular:     Rate and Rhythm: Normal rate and regular rhythm.  Pulmonary:     Effort: Pulmonary effort is normal.     Breath sounds: Normal breath sounds. No wheezing, rhonchi or rales.  Neurological:     Mental Status: He is alert.  Psychiatric:        Mood and Affect: Mood normal.        Behavior: Behavior normal.  Lab Results  Component Value Date   WBC 11.6 (H) 07/02/2022   HGB 16.5 07/02/2022   HCT 49.1 07/02/2022   PLT 288 07/02/2022   GLUCOSE 113 (H) 07/02/2022   CHOL 182 07/02/2022   TRIG 280 (H) 07/02/2022   HDL 27 (L) 07/02/2022   LDLCALC 107 (H) 07/02/2022   ALT 17 07/02/2022   AST 16 07/02/2022   NA 143 07/02/2022   K 4.2 07/02/2022   CL 103 07/02/2022   CREATININE 1.29 (H) 07/02/2022   BUN 13 07/02/2022   CO2 22 07/02/2022   HGBA1C 5.9 (H) 07/02/2022     Assessment & Plan:   Problem List Items Addressed This Visit       Other   Elevated BP without diagnosis of hypertension    Advised to monitor blood pressures at home.  We discussed diet briefly today.  Stay active.      Gout    No recent flares.  Continue allopurinol.      HLD (hyperlipidemia)    Tolerating statin.  Continue.      Tobacco abuse    Sending in Chantix again.       Meds ordered this encounter  Medications   varenicline (CHANTIX) 1 MG tablet    Sig: Take 1 tablet (1 mg total) by mouth 2 (two) times daily. Day 8 of treatment and thereafter.     Dispense:  180 tablet    Refill:  1   varenicline (CHANTIX) 0.5 MG tablet    Sig: 1 tablet daily on days 1-3, then 1 tablet twice daily on days 4-7.    Dispense:  11 tablet    Refill:  0    Follow-up:  Return in about 3 months (around 01/04/2023).  [place]

## 2022-10-05 NOTE — Assessment & Plan Note (Signed)
Tolerating statin. Continue. 

## 2022-10-05 NOTE — Patient Instructions (Signed)
Medications as prescribed.  Mediterranean diet.  Follow-up in 3 months.

## 2022-12-25 ENCOUNTER — Other Ambulatory Visit: Payer: Self-pay | Admitting: Family Medicine

## 2023-01-04 ENCOUNTER — Ambulatory Visit: Payer: BC Managed Care – PPO | Admitting: Family Medicine

## 2023-01-04 ENCOUNTER — Encounter: Payer: Self-pay | Admitting: Family Medicine

## 2023-01-04 VITALS — BP 153/98 | HR 76 | Temp 97.6°F | Ht 66.0 in | Wt 243.0 lb

## 2023-01-04 DIAGNOSIS — E785 Hyperlipidemia, unspecified: Secondary | ICD-10-CM

## 2023-01-04 DIAGNOSIS — Z720 Tobacco use: Secondary | ICD-10-CM

## 2023-01-04 DIAGNOSIS — R7303 Prediabetes: Secondary | ICD-10-CM | POA: Insufficient documentation

## 2023-01-04 DIAGNOSIS — I10 Essential (primary) hypertension: Secondary | ICD-10-CM | POA: Diagnosis not present

## 2023-01-04 DIAGNOSIS — N529 Male erectile dysfunction, unspecified: Secondary | ICD-10-CM | POA: Diagnosis not present

## 2023-01-04 DIAGNOSIS — Z1211 Encounter for screening for malignant neoplasm of colon: Secondary | ICD-10-CM

## 2023-01-04 MED ORDER — AMLODIPINE BESYLATE 5 MG PO TABS: 5.0000 mg | ORAL_TABLET | Freq: Every day | ORAL | 3 refills | Status: DC

## 2023-01-04 MED ORDER — TADALAFIL 10 MG PO TABS: 10.0000 mg | ORAL_TABLET | Freq: Every day | ORAL | 5 refills | Status: DC | PRN

## 2023-01-04 NOTE — Patient Instructions (Signed)
Referrals are in.  Medications at the pharmacy.  Follow-up in 3 months regarding your BP.  Take care  Dr. Lacinda Axon

## 2023-01-06 DIAGNOSIS — N529 Male erectile dysfunction, unspecified: Secondary | ICD-10-CM | POA: Insufficient documentation

## 2023-01-06 DIAGNOSIS — I10 Essential (primary) hypertension: Secondary | ICD-10-CM | POA: Insufficient documentation

## 2023-01-06 NOTE — Assessment & Plan Note (Signed)
CT lung cancer screening ordered. Recommend cessation.

## 2023-01-06 NOTE — Assessment & Plan Note (Signed)
Treating with tadalafil.

## 2023-01-06 NOTE — Progress Notes (Signed)
Subjective:  Patient ID: Lee Carter, male    DOB: 12-Feb-1956  Age: 67 y.o. MRN: [id number]  CC: Chief Complaint  Patient presents with   Hypertension    Elevated BP readings   cancer screenings    Colon and lungs    HPI:  67 year old male with gout, obesity, hyperlipidemia, tobacco abuse, and prediabetes presents for follow up.  Patient's BP remains elevated. This meets criteria for diagnosis of hypertension. Will discuss treatment today.  Patient reports difficulty with ED. Would like treatment today.  Additionally, patient would like to discuss screenings today. He is interested in CT lung cancer screening as well as colon cancer screening.   Patient Active Problem List   Diagnosis Date Noted   Essential hypertension 01/06/2023   Erectile dysfunction 01/06/2023   Prediabetes 01/04/2023   Tobacco abuse 07/03/2022   Gout 01/12/2022   HLD (hyperlipidemia) 01/12/2022    Social Hx   Social History   Socioeconomic History   Marital status: Married    Spouse name: Not on file   Number of children: Not on file   Years of education: Not on file   Highest education level: Not on file  Occupational History   Not on file  Tobacco Use   Smoking status: Every Day    Packs/day: 0.75    Years: 40.00    Additional pack years: 0.00    Total pack years: 30.00    Types: Cigarettes   Smokeless tobacco: Not on file  Substance and Sexual Activity   Alcohol use: Not Currently   Drug use: Not on file   Sexual activity: Not on file  Other Topics Concern   Not on file  Social History Narrative   Not on file   Social Determinants of Health   Financial Resource Strain: Not on file  Food Insecurity: Not on file  Transportation Needs: Not on file  Physical Activity: Not on file  Stress: Not on file  Social Connections: Not on file    Review of Systems Per HPI  Objective:  BP (!) 153/98   Pulse 76   Temp 97.6 F (36.4 C)   Ht 5\' 6"  (1.676 m)   Wt 243 lb (110.2  kg)   SpO2 98%   BMI 39.22 kg/m      01/04/2023    2:38 PM 01/04/2023    2:30 PM 10/05/2022    1:08 PM  BP/Weight  Systolic BP 0000000 [id number] [id number]  Diastolic BP 98 98 95  Wt. (Lbs)  243 242  BMI  39.22 kg/m2 39.06 kg/m2    Physical Exam Vitals and nursing note reviewed.  Constitutional:      Appearance: Normal appearance. He is obese.  Eyes:     General:        Right eye: No discharge.        Left eye: No discharge.     Conjunctiva/sclera: Conjunctivae normal.  Cardiovascular:     Rate and Rhythm: Normal rate and regular rhythm.  Pulmonary:     Effort: Pulmonary effort is normal.     Breath sounds: Normal breath sounds. No wheezing or rales.  Neurological:     Mental Status: He is alert.  Psychiatric:        Mood and Affect: Mood normal.        Behavior: Behavior normal.    Lab Results  Component Value Date   WBC 11.6 (H) 07/02/2022   HGB 16.5 07/02/2022   HCT 49.1 07/02/2022  PLT 288 07/02/2022   GLUCOSE 113 (H) 07/02/2022   CHOL 182 07/02/2022   TRIG 280 (H) 07/02/2022   HDL 27 (L) 07/02/2022   LDLCALC 107 (H) 07/02/2022   ALT 17 07/02/2022   AST 16 07/02/2022   NA 143 07/02/2022   K 4.2 07/02/2022   CL 103 07/02/2022   CREATININE 1.29 (H) 07/02/2022   BUN 13 07/02/2022   CO2 22 07/02/2022   HGBA1C 5.9 (H) 07/02/2022     Assessment & Plan:   Problem List Items Addressed This Visit       Cardiovascular and Mediastinum   Essential hypertension - Primary    Uncontrolled. Starting norvasc. Follow up in 3 months.       Relevant Medications   tadalafil (CIALIS) 10 MG tablet   amLODipine (NORVASC) 5 MG tablet     Other   Erectile dysfunction    Treating with tadalafil.       HLD (hyperlipidemia)    Continue statin. Labs at follow up.      Relevant Medications   tadalafil (CIALIS) 10 MG tablet   amLODipine (NORVASC) 5 MG tablet   Tobacco abuse    CT lung cancer screening ordered. Recommend cessation.      Relevant Orders   Ambulatory  Referral Lung Cancer Screening [facility]   Other Visit Diagnoses     Encounter for screening colonoscopy       Relevant Orders   Ambulatory referral to Gastroenterology       Meds ordered this encounter  Medications   tadalafil (CIALIS) 10 MG tablet    Sig: Take 1 tablet (10 mg total) by mouth daily as needed for erectile dysfunction.    Dispense:  10 tablet    Refill:  5   amLODipine (NORVASC) 5 MG tablet    Sig: Take 1 tablet (5 mg total) by mouth daily.    Dispense:  90 tablet    Refill:  3    Follow-up:  Return in about 3 months (around 04/06/2023) for HTN follow up.  [place]

## 2023-01-06 NOTE — Assessment & Plan Note (Signed)
Continue statin. Labs at follow up.

## 2023-01-06 NOTE — Assessment & Plan Note (Signed)
Uncontrolled. Starting norvasc. Follow up in 3 months.

## 2023-01-07 ENCOUNTER — Encounter: Payer: Self-pay | Admitting: *Deleted

## 2023-03-28 ENCOUNTER — Ambulatory Visit: Payer: BC Managed Care – PPO | Admitting: Family Medicine

## 2023-03-28 DIAGNOSIS — D72829 Elevated white blood cell count, unspecified: Secondary | ICD-10-CM | POA: Diagnosis not present

## 2023-03-28 DIAGNOSIS — I10 Essential (primary) hypertension: Secondary | ICD-10-CM | POA: Diagnosis not present

## 2023-03-28 DIAGNOSIS — R7303 Prediabetes: Secondary | ICD-10-CM | POA: Diagnosis not present

## 2023-03-28 DIAGNOSIS — E785 Hyperlipidemia, unspecified: Secondary | ICD-10-CM

## 2023-03-28 DIAGNOSIS — M1A9XX0 Chronic gout, unspecified, without tophus (tophi): Secondary | ICD-10-CM

## 2023-03-28 DIAGNOSIS — Z720 Tobacco use: Secondary | ICD-10-CM

## 2023-03-28 DIAGNOSIS — N529 Male erectile dysfunction, unspecified: Secondary | ICD-10-CM

## 2023-03-28 DIAGNOSIS — Z1211 Encounter for screening for malignant neoplasm of colon: Secondary | ICD-10-CM

## 2023-03-28 MED ORDER — TADALAFIL 20 MG PO TABS: 20.0000 mg | ORAL_TABLET | Freq: Every day | ORAL | 1 refills | Status: DC | PRN

## 2023-03-28 NOTE — Assessment & Plan Note (Signed)
Stable. °-Continue amlodipine °

## 2023-03-28 NOTE — Assessment & Plan Note (Signed)
Patient not having significant response to 10 mg of tadalafil.  Increasing to 20 mg.

## 2023-03-28 NOTE — Assessment & Plan Note (Signed)
A1c today.  

## 2023-03-28 NOTE — Progress Notes (Signed)
Subjective:  Patient ID: Lee Carter, male    DOB: 08-24-1956  Age: 67 y.o. MRN: [id number]  CC: Chief Complaint  Patient presents with   Hypertension    Follow up [place] of [place] cessation    HPI:  67 year old male with hypertension, gout, prediabetes, tobacco abuse, hyperlipidemia, erectile dysfunction presents for follow-up.  For some reason, he has not yet had consultation regarding lung cancer screening or colonoscopy.  Referrals have been placed.  He is interested in getting these done while he is out for the summer.  Patient states that his smoking has improved.  He has cut back to approximately 1/4 pack a day.  He is hopefully going to quit over the summer.  Patient reports that Cialis is not currently working well for him.  He is taking 10 mg as needed.  Will discuss today.  Hypertension stable on amlodipine.  No recent gout flares.  On allopurinol.  Patient tolerating statin.  Patient needs labs today.  Patient Active Problem List   Diagnosis Date Noted   Essential hypertension 01/06/2023   Erectile dysfunction 01/06/2023   Prediabetes 01/04/2023   Tobacco abuse 07/03/2022   Gout 01/12/2022   HLD (hyperlipidemia) 01/12/2022    Social Hx   Social History   Socioeconomic History   Marital status: Married    Spouse name: Not on file   Number of children: Not on file   Years of education: Not on file   Highest education level: Not on file  Occupational History   Not on file  Tobacco Use   Smoking status: Every Day    Packs/day: 0.75    Years: 40.00    Additional pack years: 0.00    Total pack years: 30.00    Types: Cigarettes   Smokeless tobacco: Not on file  Substance and Sexual Activity   Alcohol use: Not Currently   Drug use: Not on file   Sexual activity: Not on file  Other Topics Concern   Not on file  Social History Narrative   Not on file   Social Determinants of Health   Financial Resource Strain: Not on file   Food Insecurity: Not on file  Transportation Needs: Not on file  Physical Activity: Not on file  Stress: Not on file  Social Connections: Not on file    Review of Systems Per HPI  Objective:  BP 119/73   Ht 5\' 6"  (1.676 m)   Wt 243 lb 6.4 oz (110.4 kg)   BMI 39.29 kg/m      03/28/2023    2:44 PM 01/04/2023    2:38 PM 01/04/2023    2:30 PM  BP/Weight  Systolic BP 119 153 159  Diastolic BP 73 98 98  Wt. (Lbs) 243.4  243  BMI 39.29 kg/m2  39.22 kg/m2    Physical Exam Vitals and nursing note reviewed.  Constitutional:      General: He is not in acute distress.    Appearance: Normal appearance. He is obese.  HENT:     Head: Normocephalic and atraumatic.  Eyes:     General:        Right eye: No discharge.        Left eye: No discharge.     Conjunctiva/sclera: Conjunctivae normal.  Cardiovascular:     Rate and Rhythm: Normal rate and regular rhythm.  Pulmonary:     Effort: Pulmonary effort is normal.     Breath sounds: Normal breath sounds. No wheezing, rhonchi  or rales.  Neurological:     Mental Status: He is alert.  Psychiatric:        Mood and Affect: Mood normal.        Behavior: Behavior normal.     Lab Results  Component Value Date   WBC 11.6 (H) 07/02/2022   HGB 16.5 07/02/2022   HCT 49.1 07/02/2022   PLT 288 07/02/2022   GLUCOSE 113 (H) 07/02/2022   CHOL 182 07/02/2022   TRIG 280 (H) 07/02/2022   HDL 27 (L) 07/02/2022   LDLCALC 107 (H) 07/02/2022   ALT 17 07/02/2022   AST 16 07/02/2022   NA 143 07/02/2022   K 4.2 07/02/2022   CL 103 07/02/2022   CREATININE 1.29 (H) 07/02/2022   BUN 13 07/02/2022   CO2 22 07/02/2022   HGBA1C 5.9 (H) 07/02/2022     Assessment & Plan:   Problem List Items Addressed This Visit       Cardiovascular and Mediastinum   Essential hypertension    Stable.  Continue amlodipine.      Relevant Medications   tadalafil (CIALIS) 20 MG tablet   Other Relevant Orders   CMP14+EGFR   Microalbumin / creatinine urine  ratio     Other   Tobacco abuse    Patient cutting back.  Hopefully he will quit soon.      Relevant Orders   Ambulatory Referral Lung Cancer Screening [facility]   Prediabetes    A1c today.      Relevant Orders   Hemoglobin A1c   HLD (hyperlipidemia)    Lipid panel to evaluate.  Continue Lipitor.      Relevant Medications   tadalafil (CIALIS) 20 MG tablet   Other Relevant Orders   Lipid panel   Gout    Uric acid to assess.  No recent flares.  Continue allopurinol.      Relevant Orders   Uric Acid   Erectile dysfunction    Patient not having significant response to 10 mg of tadalafil.  Increasing to 20 mg.      Other Visit Diagnoses     Leukocytosis, unspecified type       Relevant Orders   CBC   Encounter for screening colonoscopy       Relevant Orders   Ambulatory referral to Gastroenterology       Meds ordered this encounter  Medications   tadalafil (CIALIS) 20 MG tablet    Sig: Take 1 tablet (20 mg total) by mouth daily as needed for erectile dysfunction.    Dispense:  30 tablet    Refill:  1    Follow-up:  Return in about 6 months (around 09/27/2023).  Everlene Other DO [facility]

## 2023-03-28 NOTE — Assessment & Plan Note (Signed)
Patient cutting back.  Hopefully he will quit soon.

## 2023-03-28 NOTE — Assessment & Plan Note (Signed)
Uric acid to assess.  No recent flares.  Continue allopurinol.

## 2023-03-28 NOTE — Assessment & Plan Note (Signed)
Lipid panel to evaluate.  Continue Lipitor.

## 2023-03-28 NOTE — Patient Instructions (Signed)
Referrals placed again.  Labs ordered.  Medication sent.  Follow up in 6 months.

## 2023-03-29 LAB — CMP14+EGFR
ALT: 50 IU/L — ABNORMAL HIGH (ref 0–44)
AST: 39 IU/L (ref 0–40)
Albumin/Globulin Ratio: 2.2 (ref 1.2–2.2)
Albumin: 4.8 g/dL (ref 3.9–4.9)
Alkaline Phosphatase: 107 IU/L (ref 44–121)
BUN/Creatinine Ratio: 9 — ABNORMAL LOW (ref 10–24)
BUN: 9 mg/dL (ref 8–27)
Bilirubin Total: 0.5 mg/dL (ref 0.0–1.2)
CO2: 26 mmol/L (ref 20–29)
Calcium: 10 mg/dL (ref 8.6–10.2)
Chloride: 100 mmol/L (ref 96–106)
Creatinine, Ser: 1.02 mg/dL (ref 0.76–1.27)
Globulin, Total: 2.2 g/dL (ref 1.5–4.5)
Glucose: 167 mg/dL — ABNORMAL HIGH (ref 70–99)
Potassium: 4.4 mmol/L (ref 3.5–5.2)
Sodium: 141 mmol/L (ref 134–144)
Total Protein: 7 g/dL (ref 6.0–8.5)
eGFR: 82 mL/min/{1.73_m2} (ref >59)

## 2023-03-29 LAB — HEMOGLOBIN A1C
Est. average glucose Bld gHb Est-mCnc: 137 mg/dL
Hgb A1c MFr Bld: 6.4 % — ABNORMAL HIGH (ref 4.8–5.6)

## 2023-03-29 LAB — CBC
Hematocrit: 46.1 % (ref 37.5–51.0)
Hemoglobin: 15.4 g/dL (ref 13.0–17.7)
MCH: 31.5 pg (ref 26.6–33.0)
MCHC: 33.4 g/dL (ref 31.5–35.7)
MCV: 94 fL (ref 79–97)
Platelets: 270 10*3/uL (ref 150–450)
RBC: 4.89 x10E6/uL (ref 4.14–5.80)
RDW: 12.7 % (ref 11.6–15.4)
WBC: 9.3 10*3/uL (ref 3.4–10.8)

## 2023-03-29 LAB — LIPID PANEL
Chol/HDL Ratio: 3.7 ratio (ref 0.0–5.0)
Cholesterol, Total: 103 mg/dL (ref 100–199)
HDL: 28 mg/dL — ABNORMAL LOW (ref >39)
LDL Chol Calc (NIH): 42 mg/dL (ref 0–99)
Triglycerides: 204 mg/dL — ABNORMAL HIGH (ref 0–149)
VLDL Cholesterol Cal: 33 mg/dL (ref 5–40)

## 2023-03-29 LAB — URIC ACID: Uric Acid: 7.6 mg/dL (ref 3.8–8.4)

## 2023-03-29 LAB — MICROALBUMIN / CREATININE URINE RATIO
Creatinine, Urine: 128.9 mg/dL
Microalb/Creat Ratio: 17 mg/g creat (ref 0–29)
Microalbumin, Urine: 21.4 ug/mL

## 2023-04-02 ENCOUNTER — Encounter: Payer: Self-pay | Admitting: *Deleted

## 2023-04-07 ENCOUNTER — Other Ambulatory Visit: Payer: Self-pay | Admitting: Family Medicine

## 2023-04-29 ENCOUNTER — Encounter: Payer: Self-pay | Admitting: *Deleted

## 2023-06-12 ENCOUNTER — Other Ambulatory Visit: Payer: Self-pay | Admitting: Family Medicine

## 2023-06-12 ENCOUNTER — Other Ambulatory Visit: Payer: Self-pay | Admitting: *Deleted

## 2023-06-12 DIAGNOSIS — Z87891 Personal history of nicotine dependence: Secondary | ICD-10-CM

## 2023-06-12 DIAGNOSIS — F17210 Nicotine dependence, cigarettes, uncomplicated: Secondary | ICD-10-CM

## 2023-06-12 DIAGNOSIS — Z122 Encounter for screening for malignant neoplasm of respiratory organs: Secondary | ICD-10-CM

## 2023-06-13 ENCOUNTER — Other Ambulatory Visit: Payer: Self-pay | Admitting: Family Medicine

## 2023-06-19 ENCOUNTER — Ambulatory Visit
Admission: RE | Admit: 2023-06-19 | Discharge: 2023-06-19 | Disposition: A | Discharge status: Home / Self Care | Payer: BC Managed Care – PPO | Source: Ambulatory Visit | Attending: Acute Care | Admitting: Acute Care

## 2023-06-19 DIAGNOSIS — Z122 Encounter for screening for malignant neoplasm of respiratory organs: Secondary | ICD-10-CM | POA: Insufficient documentation

## 2023-06-19 DIAGNOSIS — F17210 Nicotine dependence, cigarettes, uncomplicated: Secondary | ICD-10-CM | POA: Insufficient documentation

## 2023-06-19 DIAGNOSIS — Z87891 Personal history of nicotine dependence: Secondary | ICD-10-CM | POA: Insufficient documentation

## 2023-06-27 ENCOUNTER — Other Ambulatory Visit: Payer: Self-pay

## 2023-06-27 DIAGNOSIS — F17210 Nicotine dependence, cigarettes, uncomplicated: Secondary | ICD-10-CM

## 2023-06-27 DIAGNOSIS — Z122 Encounter for screening for malignant neoplasm of respiratory organs: Secondary | ICD-10-CM

## 2023-06-27 DIAGNOSIS — Z87891 Personal history of nicotine dependence: Secondary | ICD-10-CM

## 2023-07-02 ENCOUNTER — Telehealth: Payer: Self-pay | Admitting: *Deleted

## 2023-07-02 ENCOUNTER — Telehealth: Payer: Self-pay | Admitting: Internal Medicine

## 2023-07-02 NOTE — Telephone Encounter (Signed)
Received questionnaire. I spoke with patient about his previous colonoscopy. He said it was done about 6 years ago, he doesn't remember who or where he had it done. He thinks it was somewhere in [place], but not sure. Questionnaire is in review.

## 2023-07-02 NOTE — Telephone Encounter (Signed)
  Procedure: colonscopy  Height: 5'6" Weight: 220 lb      BMI: 35.5  Have you had a colonoscopy before?  Yes, but doesn't remember who or where he had it done  Do you have family history of colon cancer?  no  Do you have a family history of polyps? no  Previous colonoscopy with polyps removed? no  Do you have a history colorectal cancer?   no  Are you diabetic?  no  Do you have a prosthetic or mechanical heart valve? no  Do you have a pacemaker/defibrillator?   no  Have you had endocarditis/atrial fibrillation?  no  Do you use supplemental oxygen/CPAP?  no  Have you had joint replacement within the last 12 months?  no  Do you tend to be constipated or have to use laxatives?  no   Do you have history of alcohol use? If yes, how much and how often.  no  Do you have history or are you using drugs? If yes, what do are you  using?  no  Have you ever had a stroke/heart attack?  no  Have you ever had a heart or other vascular stent placed,?no  Do you take weight loss medication? no   Do you take any blood-thinning medications such as: (Plavix, aspirin, Coumadin, Aggrenox, Brilinta, Xarelto, Eliquis, Pradaxa, Savaysa or Effient)? no  If yes we need the name, milligram, dosage and who is prescribing doctor:  n/a             Current Outpatient Medications  Medication Sig Dispense Refill   allopurinol (ZYLOPRIM) 100 MG tablet Take 1 tablet by mouth once daily 90 tablet 0   amLODipine (NORVASC) 5 MG tablet Take 1 tablet (5 mg total) by mouth daily. 90 tablet 3   atorvastatin (LIPITOR) 40 MG tablet Take 1 tablet by mouth once daily 90 tablet 0   No current facility-administered medications for this visit.    No Known Allergies

## 2023-08-31 ENCOUNTER — Other Ambulatory Visit: Payer: Self-pay | Admitting: Family Medicine

## 2023-09-11 ENCOUNTER — Other Ambulatory Visit: Payer: Self-pay | Admitting: Family Medicine

## 2023-09-27 ENCOUNTER — Ambulatory Visit: Payer: BC Managed Care – PPO | Admitting: Family Medicine

## 2023-11-13 NOTE — Telephone Encounter (Signed)
Ok to schedule. Asa 2. 

## 2023-11-14 ENCOUNTER — Other Ambulatory Visit: Payer: Self-pay | Admitting: Family Medicine

## 2023-11-14 NOTE — Telephone Encounter (Signed)
Pt states he is retiring at the first of the month and moving to [place], so he will get the colonoscopy done there.

## 2024-07-08 ENCOUNTER — Telehealth: Payer: Self-pay

## 2024-07-08 NOTE — Telephone Encounter (Signed)
 Pt has moved to [place]. Advises he will complete annual CT's up there now. No further needs.
